# Patient Record
Sex: Male | Born: 1975 | Race: White | Hispanic: No | Marital: Married | Smoking: Never smoker
Health system: Southern US, Community
[De-identification: ages and names within clinical notes are randomized; demographics above are authoritative.]

## PROBLEM LIST (undated history)

## (undated) DIAGNOSIS — I82409 Acute embolism and thrombosis of unspecified deep veins of unspecified lower extremity: Secondary | ICD-10-CM

## (undated) DIAGNOSIS — I10 Essential (primary) hypertension: Secondary | ICD-10-CM

## (undated) HISTORY — PX: ANKLE SURGERY: SHX546

---

## 2002-02-14 ENCOUNTER — Emergency Department (HOSPITAL_COMMUNITY): Admission: EM | Admit: 2002-02-14 | Discharge: 2002-02-15 | Payer: Self-pay | Admitting: Emergency Medicine

## 2007-06-25 ENCOUNTER — Emergency Department (HOSPITAL_COMMUNITY): Admission: EM | Admit: 2007-06-25 | Discharge: 2007-06-26 | Payer: Self-pay | Admitting: Emergency Medicine

## 2011-04-19 LAB — PROTIME-INR

## 2011-04-21 LAB — PROTIME-INR

## 2011-04-25 LAB — PROTIME-INR

## 2011-08-14 LAB — PROTIME-INR

## 2012-04-10 ENCOUNTER — Ambulatory Visit (HOSPITAL_COMMUNITY)
Admission: RE | Admit: 2012-04-10 | Discharge: 2012-04-10 | Disposition: A | Discharge status: Home / Self Care | Payer: 59 | Source: Ambulatory Visit | Attending: Family Medicine | Admitting: Family Medicine

## 2012-04-10 DIAGNOSIS — M79609 Pain in unspecified limb: Secondary | ICD-10-CM

## 2012-04-10 DIAGNOSIS — M7989 Other specified soft tissue disorders: Secondary | ICD-10-CM | POA: Insufficient documentation

## 2012-04-10 DIAGNOSIS — R600 Localized edema: Secondary | ICD-10-CM

## 2012-04-10 NOTE — Progress Notes (Signed)
VASCULAR LAB PRELIMINARY  PRELIMINARY  PRELIMINARY  PRELIMINARY  Right lower extremity venous duplex completed.    Preliminary report:  POSITIVE FOR DVT in the right posterior tibial veins, peroneal vein, popliteal vein, and distal to mid femoral vein. Near occlusion at all levels. Also multiple calf vein DVTs. No evidence of superficial thrombosis or Baker's cyst.  Isao Seltzer, RVS 04/10/2012, 6:05 PM

## 2012-04-10 NOTE — Research (Signed)
[employer] dIET study investigating the utility of D-Dimer to rule out the presence of DVT discussed with patient.  All questions and concerns were addressed and answered prior to obtaining informed consent.  No study procedures were initiated prior to informed consent.  A copy of the signed consent was provided to the patient.  Please contact study coordinator or Dr. Chancy Milroy for any questions or concerns.

## 2012-05-01 ENCOUNTER — Telehealth: Payer: Self-pay | Admitting: Hematology and Oncology

## 2012-05-01 NOTE — Telephone Encounter (Signed)
S/w pt re appt for 8/27 @ 1 pm. Due to pt was driving I also called and lmonvm for pt re appt d/t/location w/my name and direct number. Pt aware he should call me back ASAP should he need to change this appt.

## 2012-05-03 ENCOUNTER — Telehealth: Payer: Self-pay | Admitting: Hematology and Oncology

## 2012-05-03 NOTE — Telephone Encounter (Signed)
Delivered chart on 8/16 for a 8/27 appt. To Dr. Dalene Carrow

## 2012-05-14 ENCOUNTER — Ambulatory Visit (HOSPITAL_BASED_OUTPATIENT_CLINIC_OR_DEPARTMENT_OTHER): Payer: 59 | Admitting: Lab

## 2012-05-14 ENCOUNTER — Encounter: Payer: Self-pay | Admitting: Hematology and Oncology

## 2012-05-14 ENCOUNTER — Ambulatory Visit: Payer: 59

## 2012-05-14 ENCOUNTER — Telehealth: Payer: Self-pay | Admitting: Hematology and Oncology

## 2012-05-14 ENCOUNTER — Telehealth: Payer: Self-pay

## 2012-05-14 ENCOUNTER — Ambulatory Visit (HOSPITAL_BASED_OUTPATIENT_CLINIC_OR_DEPARTMENT_OTHER): Payer: 59 | Admitting: Hematology and Oncology

## 2012-05-14 VITALS — BP 135/86 | HR 68 | Temp 97.8°F | Resp 18 | Ht 66.75 in | Wt 205.6 lb

## 2012-05-14 DIAGNOSIS — I82409 Acute embolism and thrombosis of unspecified deep veins of unspecified lower extremity: Secondary | ICD-10-CM | POA: Insufficient documentation

## 2012-05-14 DIAGNOSIS — D539 Nutritional anemia, unspecified: Secondary | ICD-10-CM | POA: Insufficient documentation

## 2012-05-14 DIAGNOSIS — Z7901 Long term (current) use of anticoagulants: Secondary | ICD-10-CM

## 2012-05-14 DIAGNOSIS — I824Y9 Acute embolism and thrombosis of unspecified deep veins of unspecified proximal lower extremity: Secondary | ICD-10-CM

## 2012-05-14 DIAGNOSIS — I824Z9 Acute embolism and thrombosis of unspecified deep veins of unspecified distal lower extremity: Secondary | ICD-10-CM

## 2012-05-14 LAB — COMPREHENSIVE METABOLIC PANEL (CC13)
ALT: 44 U/L (ref 0–55)
AST: 24 U/L (ref 5–34)
CO2: 26 mEq/L (ref 22–29)
Sodium: 141 mEq/L (ref 136–145)
Total Bilirubin: 0.4 mg/dL (ref 0.20–1.20)
Total Protein: 7.3 g/dL (ref 6.4–8.3)

## 2012-05-14 LAB — CBC WITH DIFFERENTIAL/PLATELET
Basophils Absolute: 0 10*3/uL (ref 0.0–0.1)
Eosinophils Absolute: 0.4 10*3/uL (ref 0.0–0.5)
HGB: 15.9 g/dL (ref 13.0–17.1)
LYMPH%: 30.6 % (ref 14.0–49.0)
MCH: 31.1 pg (ref 27.2–33.4)
MONO#: 0.6 10*3/uL (ref 0.1–0.9)
MONO%: 7.3 % (ref 0.0–14.0)
Platelets: 207 10*3/uL (ref 140–400)
RBC: 5.12 10*6/uL (ref 4.20–5.82)
RDW: 13.3 % (ref 11.0–14.6)
lymph#: 2.4 10*3/uL (ref 0.9–3.3)

## 2012-05-14 NOTE — Patient Instructions (Addendum)
Kenton Kingfisher  [id number]   [facility] - AFTER VISIT SUMMARY   **RECOMMENDATIONS MADE BY THE CONSULTANT AND ANY TEST    RESULTS WILL BE SENT TO YOUR REFERRING DOCTORS.   YOUR EXAM FINDINGS, LABS AND RESULTS WERE DISCUSSED BY YOUR MD TODAY.    Your vital signs are: Filed Vitals:   05/14/12 1331  BP: 135/86  Pulse: 68  Temp: 97.8 F (36.6 C)  Resp: 18    Your Updated drug allergies are: Allergies as of 05/14/2012  . (Not on File)    Your current list of medications are: No current outpatient prescriptions on file.     INSTRUCTIONS GIVEN AND DISCUSSED:  See attached schedule   SPECIAL INSTRUCTIONS/FOLLOW-UP:  See above.  I acknowledge that I have been informed and understand all the instructions given to me and received a copy. I do not have any more questions at this time, but understand that I may call the [facility] at [phone] during business hours should I have any further questions or need assistance in obtaining follow-up care.

## 2012-05-14 NOTE — Telephone Encounter (Signed)
lvm that I spoke w/nurse at Dr Darrell Jewel office. Explained to her pt is having difficulty with pharmacy about filling xarelto Rx. She stated she would be happy to help pt when he calls.

## 2012-05-14 NOTE — Addendum Note (Signed)
Addended by: Gaylord Shih on: 05/14/2012 04:35 PM   Modules accepted: Orders

## 2012-05-14 NOTE — Telephone Encounter (Signed)
Sent pt back to lb and gv him appt for ct 8/30 and f/u 11/19/12.

## 2012-05-14 NOTE — Progress Notes (Signed)
CC:   Pam Drown, M.D.  IDENTIFYING STATEMENT:  The patient is a 36 year old man seen at the request of Dr. Corliss Blacker recurrent lower extremity deep vein thrombosis.  HISTORY OF PRESENT ILLNESS:  The patient's history dates back to a year ago. Following a beach trip he developed pain with swelling in his right calf.  He had seen an orthopedic surgeon who ordered a lower extremity doppler that noted deep vein thrombosis in the popliteal veins.  He was seen by Dr. Corliss Blacker and began Lovenox, which was subsequently bridged to Coumadin.  He was treated for total of 6 months discontinuing anticoagulation March/April 2013.  The patient reports that his INR was somewhat erratic ranging  between 1.9 and 2.1.  On 01/15/2012 Dr. Corliss Blacker obtained a hypercoagulable workup.  Results note that factor V Leiden and prothrombin gene mutation were negative; antithrombin activity was normal at 96; protein C activity 155; protein S activity was slightly low at 54; functional protein C normal at 186; functional protein S normal at 99; anticardiolipin IgG, IgA, and IgM were less than 9 respectively; lupus anticoagulants was not detected; homocystine level was 7.9.  Following a very recent beach trip, the patient had similar complaints of right lower extremity pain with swelling.  On 04/10/2012 he received a right lower extremity venous duplex that was positive for DVT in the right posterior tibial vein, peroneal vein, popliteal vein, and distal to mid thigh with near-occlusion at all levels. There were also multiple calf vein DVTs.  The patient was placed on Xarelto.  He states that this is his preference as he is reluctant to self-inject with Lovenox.  He is also reluctant to resume frequent monitoring of his PT/INR.  He notes less fatigue than when he was on Coumadin.  He denies rectal bleeding and hematemesis.  Denies abdominal pain.  He has not lost any weight.  He denies nausea and vomiting.  His  maternal grandmother was diagnosed with recurrent pulmonary embolism.   MEDICATIONS:  Xarelto 20 mg daily prescribed at Dr. Darrell Jewel office. Multivitamins.  Aleve as needed.  PAST MEDICAL HISTORY: 1. Recurrent DVT.  2.  Status post repair of right ankle fracture.  ALLERGIES:  None.  SOCIAL HISTORY:  The patient is married with 2 children.  Denies alcohol and tobacco use.  He has a Psychologist, clinical business.  FAMILY HISTORY:  As noted above.  Maternal grandmother had recurrent PEs.  HEALTH MAINTENANCE:  Does not have a tendency to receive any physicals.  REVIEW OF SYSTEMS:  Denies fever, chills, night sweats, anorexia, weight loss.  Cardiovascular:  Denies chest pain, PND, orthopnea, ankle swelling.  Respirations:  Denies cough, hemoptysis, wheeze, shortness of breath.  GI:  Denies nausea, vomiting, abdominal pain, diarrhea, melena, hematochezia.  GU:  Denies dysuria, hematuria, nocturia, frequency. Skin:  No bruising or bleeding.  Neurologic:  Denies headaches, vision change, extremity weakness.  Rest of review of systems negative.  PHYSICAL EXAMINATION:  General:  The patient is a well-appearing, well- nourished man in no distress.  Vitals:  Pulse 68, blood pressure 135/86, temperature 97.8, respirations 18, weight 205 pounds.  HEENT:  Head is atraumatic, normocephalic.  Sclerae anicteric.  Mouth moist.  Neck: Supple.  Chest:  Clear to percussion and auscultation.  CVS:  First and second heart sounds present.  No added sounds or murmurs.  Abdomen: Soft, nontender, no masses.  Bowel sounds present.  Rectal:  Deferred. Extremities:  No edema.  Pulses present and symmetrical.  CNS: Nonfocal.  IMPRESSION  AND PLAN:  Mr. 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  He was initially diagnosed a year ago and received 6 months of anticoagulation with Coumadin.  The patient notes having  difficulty to achieve a therapeutic INR during that time. Hypercoagulable workup notes a mild protein S deficiency.  He was found to have recurrence of DVT in July 2013 and he has been on Xarelto since then.  His symptoms have resolved.  He has less fatigued.  He is on the right therapy and I would recommend that he remain on Xarelto for an extended duration.  He will continue to have his prescriptions filled at Dr. Darrell Jewel office.  I feel it is important that we also rule out an occult malignancy especially with his recent recurrence.  I will have him obtain a CT scan of his chest and abdomen and pelvis which he is very agreeable to.  We will telephone him with those results.  If all is well, he will follow up in 6 months' time for continued assessment.  He is here with his wife and they had all their questions and concerns answered.  I reiterated some significant side effects of anticoagulation, specifically bruising or bleeding.  He was also reminded that Xarelto does not typically have an antidote.  However, If severe hemorrhage were to occur, recombinant factor VIIa is an option. If surgery or any invasive procedure was required, Xarelto needed to be held for at least 24 hours prior to the procedure.  Heparin, which has a short half- life, could be used as a bridge.      ______________________________ Laurice Record, M.D. LIO/MEDQ  D:  05/14/2012  T:  05/14/2012  Job:  [id number]

## 2012-05-14 NOTE — Progress Notes (Signed)
This office note has been dictated.

## 2012-05-15 ENCOUNTER — Telehealth: Payer: Self-pay | Admitting: *Deleted

## 2012-05-15 NOTE — Telephone Encounter (Signed)
Called pt at home and left message on voice mail re:  Asked pt to call nurse back for lab results.

## 2012-05-16 ENCOUNTER — Telehealth: Payer: Self-pay | Admitting: *Deleted

## 2012-05-16 NOTE — Telephone Encounter (Signed)
Called pt on cell phone and left message on voice mail re:  Lab results  Good as per md.

## 2012-05-17 ENCOUNTER — Telehealth: Payer: Self-pay | Admitting: *Deleted

## 2012-05-17 ENCOUNTER — Ambulatory Visit (HOSPITAL_COMMUNITY)
Admission: RE | Admit: 2012-05-17 | Discharge: 2012-05-17 | Disposition: A | Discharge status: Home / Self Care | Payer: 59 | Source: Ambulatory Visit | Attending: Hematology and Oncology | Admitting: Hematology and Oncology

## 2012-05-17 DIAGNOSIS — C189 Malignant neoplasm of colon, unspecified: Secondary | ICD-10-CM | POA: Insufficient documentation

## 2012-05-17 DIAGNOSIS — D539 Nutritional anemia, unspecified: Secondary | ICD-10-CM

## 2012-05-17 DIAGNOSIS — Z86718 Personal history of other venous thrombosis and embolism: Secondary | ICD-10-CM | POA: Insufficient documentation

## 2012-05-17 DIAGNOSIS — R911 Solitary pulmonary nodule: Secondary | ICD-10-CM | POA: Insufficient documentation

## 2012-05-17 DIAGNOSIS — I82409 Acute embolism and thrombosis of unspecified deep veins of unspecified lower extremity: Secondary | ICD-10-CM

## 2012-05-17 MED ORDER — IOHEXOL 300 MG/ML  SOLN
100.0000 mL | Freq: Once | INTRAMUSCULAR | Status: AC | PRN
  Administered 2012-05-17: 100 mL via INTRAVENOUS

## 2012-05-17 NOTE — Telephone Encounter (Signed)
Spoke with pt and informed pt re:  CT scans showed no evidence of occult malignancy.   Instructed pt to continue to f/u with primary for Xarelto.   Confirmed appt with Dr. Dalene Carrow in March 2014.   Above instructions from Dr. Dalene Carrow.   Pt voiced understanding.

## 2012-05-17 NOTE — Telephone Encounter (Signed)
Received call from wife Trula Ore re:  Pt had CT scans done this am.  Pt and wife would like to know results.    Pt's  Cell     [phone]  ;    Jackquline Denmark      [phone].

## 2012-06-03 ENCOUNTER — Telehealth: Payer: Self-pay | Admitting: *Deleted

## 2012-06-03 ENCOUNTER — Ambulatory Visit (HOSPITAL_COMMUNITY)
Admission: RE | Admit: 2012-06-03 | Discharge: 2012-06-03 | Disposition: A | Discharge status: Home / Self Care | Payer: 59 | Source: Ambulatory Visit | Attending: Family Medicine | Admitting: Family Medicine

## 2012-06-03 ENCOUNTER — Other Ambulatory Visit: Payer: Self-pay | Admitting: Family

## 2012-06-03 DIAGNOSIS — I82409 Acute embolism and thrombosis of unspecified deep veins of unspecified lower extremity: Secondary | ICD-10-CM

## 2012-06-03 DIAGNOSIS — I82509 Chronic embolism and thrombosis of unspecified deep veins of unspecified lower extremity: Secondary | ICD-10-CM | POA: Insufficient documentation

## 2012-06-03 DIAGNOSIS — M7989 Other specified soft tissue disorders: Secondary | ICD-10-CM

## 2012-06-03 NOTE — Progress Notes (Signed)
Right:  DVT noted in the distal femoral and popliteal veins.  No evidence of superficial thrombosis.  No Baker's cyst.  Left:  Negative for DVT, SVT, or a Baker's cyst.

## 2012-06-03 NOTE — Telephone Encounter (Signed)
Received call from [facility] in Vascular Lab re:  Doppler study showed  Residual  Clot  In  Right  Leg from popliteal to distal femoral.  No new clots  per Dois Davenport.   Annice Pih, NP notified of above results.  Informed Dois Davenport that pt can go home;  Pt needs to f/u with his primary Dr. Corliss Blacker in am for further instructions -  As per NP's instructions.  Dois Davenport voiced understanding and stated she would relay message to pt.

## 2012-06-03 NOTE — Telephone Encounter (Signed)
Pt call to inform nurse re:  Pt experienced more swelling in Right leg today,  Denied pain.   Pt is still taking Xarelto as instructed by his primary.   Pt stated he called primary office and was offered to see another provider due to Dr. Corliss Blacker is out of office.   Pt declined.   Pt was instructed by their office to call Dr. Dalene Carrow.     Annice Pih, NP notified.    Instructed pt to go to [facility] for doppler study today at 4 pm as per NP's instructions.   Pt voiced understanding.

## 2012-06-06 ENCOUNTER — Telehealth: Payer: Self-pay | Admitting: *Deleted

## 2012-06-06 NOTE — Telephone Encounter (Signed)
Called pt to f/u on whether pt had contacted his primary for the increased right leg swelling.   Pt stated " NO " ;  Pt stated he was very busy at work; has no days off.  Pt stated the right leg is still swollen but not as bad.  Pt is still taking Xarelto as instructed by Dr. Corliss Blacker.   Instructed pt that he needed to f/u with his primary for further evaluation.  Pt informed nurse that he would contact primary office soon. Called Dr. Darrell Jewel office and spoke with Wayne.  Gave Chianera info of pt calling Dr. Lonell Face office on 06/03/12 about right leg swelling.  Per Harless Litten,  Message will be relayed to both Dr. Corliss Blacker and her nurse to f/u on 06/07/12 - since they had left for the day.

## 2012-10-19 ENCOUNTER — Telehealth: Payer: Self-pay | Admitting: Hematology and Oncology

## 2012-11-19 ENCOUNTER — Ambulatory Visit: Payer: 59 | Admitting: Hematology and Oncology

## 2013-06-12 ENCOUNTER — Ambulatory Visit (HOSPITAL_COMMUNITY)
Admission: RE | Admit: 2013-06-12 | Discharge: 2013-06-12 | Disposition: A | Discharge status: Home / Self Care | Payer: 59 | Source: Ambulatory Visit | Attending: Family Medicine | Admitting: Family Medicine

## 2013-06-12 ENCOUNTER — Other Ambulatory Visit (HOSPITAL_COMMUNITY): Payer: Self-pay | Admitting: Family Medicine

## 2013-06-12 DIAGNOSIS — Z09 Encounter for follow-up examination after completed treatment for conditions other than malignant neoplasm: Secondary | ICD-10-CM

## 2013-06-12 DIAGNOSIS — Z86718 Personal history of other venous thrombosis and embolism: Secondary | ICD-10-CM

## 2013-06-12 DIAGNOSIS — Z79899 Other long term (current) drug therapy: Secondary | ICD-10-CM | POA: Insufficient documentation

## 2013-06-12 NOTE — Progress Notes (Signed)
*  Preliminary Results* Right lower extremity venous duplex completed. Right lower extremity is positive for subacute vs. age indeterminate deep vein thrombosis involving the right popliteal vein. There is no evidence of right Baker's cyst.  Preliminary results discussed with Annabelle Harman, RN.  06/12/2013 11:26 AM  Gertie Fey, RVT, RDCS, RDMS

## 2013-09-22 ENCOUNTER — Emergency Department (HOSPITAL_BASED_OUTPATIENT_CLINIC_OR_DEPARTMENT_OTHER): Payer: 59

## 2013-09-22 ENCOUNTER — Encounter (HOSPITAL_BASED_OUTPATIENT_CLINIC_OR_DEPARTMENT_OTHER): Payer: Self-pay | Admitting: Emergency Medicine

## 2013-09-22 ENCOUNTER — Inpatient Hospital Stay (HOSPITAL_BASED_OUTPATIENT_CLINIC_OR_DEPARTMENT_OTHER)
Admission: EM | Admit: 2013-09-22 | Discharge: 2013-09-22 | DRG: 176 | Disposition: A | Discharge status: Home / Self Care | Payer: 59 | Attending: Internal Medicine | Admitting: Internal Medicine

## 2013-09-22 DIAGNOSIS — D539 Nutritional anemia, unspecified: Secondary | ICD-10-CM

## 2013-09-22 DIAGNOSIS — D6859 Other primary thrombophilia: Secondary | ICD-10-CM | POA: Diagnosis present

## 2013-09-22 DIAGNOSIS — I82401 Acute embolism and thrombosis of unspecified deep veins of right lower extremity: Secondary | ICD-10-CM

## 2013-09-22 DIAGNOSIS — Z79899 Other long term (current) drug therapy: Secondary | ICD-10-CM

## 2013-09-22 DIAGNOSIS — I2699 Other pulmonary embolism without acute cor pulmonale: Principal | ICD-10-CM

## 2013-09-22 DIAGNOSIS — Z86718 Personal history of other venous thrombosis and embolism: Secondary | ICD-10-CM

## 2013-09-22 DIAGNOSIS — Z86711 Personal history of pulmonary embolism: Secondary | ICD-10-CM

## 2013-09-22 DIAGNOSIS — I82409 Acute embolism and thrombosis of unspecified deep veins of unspecified lower extremity: Secondary | ICD-10-CM

## 2013-09-22 HISTORY — DX: Acute embolism and thrombosis of unspecified deep veins of unspecified lower extremity: I82.409

## 2013-09-22 LAB — BASIC METABOLIC PANEL
BUN: 17 mg/dL (ref 6–23)
CALCIUM: 9.1 mg/dL (ref 8.4–10.5)
CO2: 25 mEq/L (ref 19–32)
Chloride: 105 mEq/L (ref 96–112)
Creatinine, Ser: 0.9 mg/dL (ref 0.50–1.35)
GLUCOSE: 106 mg/dL — AB (ref 70–99)
Potassium: 4 mEq/L (ref 3.7–5.3)
SODIUM: 143 meq/L (ref 137–147)

## 2013-09-22 LAB — CBC WITH DIFFERENTIAL/PLATELET
BASOS PCT: 0 % (ref 0–1)
Basophils Absolute: 0 10*3/uL (ref 0.0–0.1)
EOS PCT: 4 % (ref 0–5)
Eosinophils Absolute: 0.3 10*3/uL (ref 0.0–0.7)
HCT: 44.3 % (ref 39.0–52.0)
Hemoglobin: 15.6 g/dL (ref 13.0–17.0)
Lymphocytes Relative: 20 % (ref 12–46)
Lymphs Abs: 1.9 10*3/uL (ref 0.7–4.0)
MCH: 31 pg (ref 26.0–34.0)
MCHC: 35.2 g/dL (ref 30.0–36.0)
MCV: 88.1 fL (ref 78.0–100.0)
MONO ABS: 1.1 10*3/uL — AB (ref 0.1–1.0)
MONOS PCT: 11 % (ref 3–12)
Neutro Abs: 6.2 10*3/uL (ref 1.7–7.7)
Neutrophils Relative %: 65 % (ref 43–77)
Platelets: 180 10*3/uL (ref 150–400)
RBC: 5.03 MIL/uL (ref 4.22–5.81)
RDW: 12.9 % (ref 11.5–15.5)
WBC: 9.6 10*3/uL (ref 4.0–10.5)

## 2013-09-22 LAB — TROPONIN I: Troponin I: <0.3 ng/mL (ref <0.30)

## 2013-09-22 LAB — HEPARIN LEVEL (UNFRACTIONATED): Heparin Unfractionated: 0.29 IU/mL — ABNORMAL LOW (ref 0.30–0.70)

## 2013-09-22 MED ORDER — ONDANSETRON HCL 4 MG/2ML IJ SOLN: 4.0000 mg | Freq: Four times a day (QID) | INTRAMUSCULAR | Status: DC | PRN

## 2013-09-22 MED ORDER — DOCUSATE SODIUM 100 MG PO CAPS
100.0000 mg | ORAL_CAPSULE | Freq: Two times a day (BID) | ORAL | Status: DC
  Filled 2013-09-22 (×2): qty 1

## 2013-09-22 MED ORDER — HEPARIN (PORCINE) IN NACL 100-0.45 UNIT/ML-% IJ SOLN
15.0000 [IU]/kg/h | Freq: Once | INTRAMUSCULAR | Status: AC
  Administered 2013-09-22: 15 [IU]/kg/h via INTRAVENOUS
  Filled 2013-09-22: qty 250

## 2013-09-22 MED ORDER — RIVAROXABAN 15 MG PO TABS
15.0000 mg | ORAL_TABLET | Freq: Two times a day (BID) | ORAL | Status: DC
  Administered 2013-09-22: 15 mg via ORAL
  Filled 2013-09-22 (×4): qty 1

## 2013-09-22 MED ORDER — ONDANSETRON HCL 4 MG PO TABS: 4.0000 mg | ORAL_TABLET | Freq: Four times a day (QID) | ORAL | Status: DC | PRN

## 2013-09-22 MED ORDER — HYDROCODONE-ACETAMINOPHEN 5-325 MG PO TABS
1.0000 | ORAL_TABLET | ORAL | Status: DC | PRN
  Administered 2013-09-22: 2 via ORAL
  Filled 2013-09-22: qty 2

## 2013-09-22 MED ORDER — ACETAMINOPHEN 650 MG RE SUPP: 650.0000 mg | Freq: Four times a day (QID) | RECTAL | Status: DC | PRN

## 2013-09-22 MED ORDER — RIVAROXABAN (XARELTO) EDUCATION KIT FOR DVT/PE PATIENTS
PACK | Freq: Once | Status: AC
  Administered 2013-09-22: 12:00:00
  Filled 2013-09-22: qty 1

## 2013-09-22 MED ORDER — IOHEXOL 350 MG/ML SOLN
80.0000 mL | Freq: Once | INTRAVENOUS | Status: AC | PRN
  Administered 2013-09-22: 80 mL via INTRAVENOUS

## 2013-09-22 MED ORDER — ACETAMINOPHEN 325 MG PO TABS: 650.0000 mg | ORAL_TABLET | Freq: Four times a day (QID) | ORAL | Status: DC | PRN

## 2013-09-22 MED ORDER — RIVAROXABAN 20 MG PO TABS: 20.0000 mg | ORAL_TABLET | Freq: Every day | ORAL | Status: DC

## 2013-09-22 MED ORDER — MORPHINE SULFATE 2 MG/ML IJ SOLN: 2.0000 mg | INTRAMUSCULAR | Status: DC | PRN

## 2013-09-22 MED ORDER — SODIUM CHLORIDE 0.9 % IV SOLN: INTRAVENOUS | Status: DC

## 2013-09-22 MED ORDER — HEPARIN (PORCINE) IN NACL 100-0.45 UNIT/ML-% IJ SOLN
1350.0000 [IU]/h | INTRAMUSCULAR | Status: DC
  Administered 2013-09-22: 1350 [IU]/h via INTRAVENOUS
  Filled 2013-09-22: qty 250

## 2013-09-22 MED ORDER — MORPHINE SULFATE 4 MG/ML IJ SOLN
4.0000 mg | Freq: Once | INTRAMUSCULAR | Status: AC
  Administered 2013-09-22: 4 mg via INTRAVENOUS
  Filled 2013-09-22: qty 1

## 2013-09-22 MED ORDER — RIVAROXABAN (XARELTO) VTE STARTER PACK (15 & 20 MG): ORAL_TABLET | ORAL | Status: AC

## 2013-09-22 MED ORDER — SODIUM CHLORIDE 0.9 % IJ SOLN: 3.0000 mL | Freq: Two times a day (BID) | INTRAMUSCULAR | Status: DC

## 2013-09-22 NOTE — H&P (Addendum)
PCP: MCNEILL,WENDY, MD  Hematologist Odagwu  Chief Complaint:  Shortness of breath  HPI: Glenn Jackson is a 38 y.o. male   has a past medical history of DVT (deep venous thrombosis).   Presented with  Patient have had DVT  Twice in the past. He was diagnoses with "some protein deficiency". Patient was Diagnosed with last DVT in august 2013 and just finished 1 year course of Xarelto this September 3 months ago. Patient was at his baseline of health and took a trip to [place] by car. He denies any leg swelling but started to have pleuritic chest pain. He presented to ER and was diagnosed with Segment bilateral  PE.  Patient was started on Heparin and transferred to [facility]. A venous doppler was done that showed right mid and distal superficial femoral vein.  Patient at this point states he is not interested in having an IVF placed.   Review of Systems:   Pertinent positives include:  chest pain,  Constitutional:  No weight loss, night sweats, Fevers, chills, fatigue, weight loss  HEENT:  No headaches, Difficulty swallowing,Tooth/dental problems,Sore throat,  No sneezing, itching, ear ache, nasal congestion, post nasal drip,  Cardio-vascular:  No Orthopnea, PND, anasarca, dizziness, palpitations.no Bilateral lower extremity swelling  GI:  No heartburn, indigestion, abdominal pain, nausea, vomiting, diarrhea, change in bowel habits, loss of appetite, melena, blood in stool, hematemesis Resp:  no shortness of breath at rest. No dyspnea on exertion, No excess mucus, no productive cough, No non-productive cough, No coughing up of blood.No change in color of mucus.No wheezing. Skin:  no rash or lesions. No jaundice GU:  no dysuria, change in color of urine, no urgency or frequency. No straining to urinate.  No flank pain.  Musculoskeletal:  No joint pain or no joint swelling. No decreased range of motion. No back pain.  Psych:  No change in mood or affect. No depression or anxiety. No  memory loss.  Neuro: no localizing neurological complaints, no tingling, no weakness, no double vision, no gait abnormality, no slurred speech, no confusion  Otherwise ROS are negative except for above, 10 systems were reviewed  Past Medical History: Past Medical History  Diagnosis Date  . DVT (deep venous thrombosis)    Past Surgical History  Procedure Laterality Date  . Ankle surgery       Medications: Prior to Admission medications   Medication Sig Start Date End Date Taking? Authorizing Provider  Multiple Vitamin (MULTIVITAMIN) tablet Take 1 tablet by mouth daily.    Historical Provider, MD  Rivaroxaban (XARELTO) 15 MG TABS tablet Take 15 mg by mouth daily.    Historical Provider, MD    Allergies:   Allergies  Allergen Reactions  . Ibuprofen Other (See Comments)    Mouth sores  . Pseudoephedrine Other (See Comments)    hypertension    Social History:  Ambulatory   independently   Lives at   Home with wife   reports that he has never smoked. He does not have any smokeless tobacco history on file. He reports that he drinks alcohol. He reports that he does not use illicit drugs.   Family History: family history is not on file.    Physical Exam: Patient Vitals for the past 24 hrs:  BP Temp Temp src Pulse Resp SpO2 Height Weight  09/22/13 0535 138/82 mmHg 97.6 F (36.4 C) Oral 61 18 98 % 5\' 7"  (1.702 m) 91.989 kg (202 lb 12.8 oz)  09/22/13 0416 134/67 mmHg - -  72 18 97 % - -  09/22/13 0329 162/80 mmHg - - 73 18 98 % - -  09/22/13 0154 145/96 mmHg 98.5 F (36.9 C) Oral 71 16 98 % 5\' 7"  (1.702 m) 90.719 kg (200 lb)    1. General:  in No Acute distress 2. Psychological: Alert and   Oriented 3. Head/ENT:   Moist  Mucous Membranes                          Head Non traumatic, neck supple                          Normal   Dentition 4. SKIN: normal   Skin turgor,  Skin clean Dry and intact no rash 5. Heart: Regular rate and rhythm no Murmur, Rub or gallop 6.  Lungs: Clear to auscultation bilaterally, no wheezes or crackles   7. Abdomen: Soft, non-tender, Non distended 8. Lower extremities: no clubbing, cyanosis, or edema 9. Neurologically Grossly intact, moving all 4 extremities equally 10. MSK: Normal range of motion  body mass index is 31.76 kg/(m^2).   Labs on Admission:   Recent Labs  09/22/13 0155  NA 143  K 4.0  CL 105  CO2 25  GLUCOSE 106*  BUN 17  CREATININE 0.90  CALCIUM 9.1   No results found for this basename: AST, ALT, ALKPHOS, BILITOT, PROT, ALBUMIN,  in the last 72 hours No results found for this basename: LIPASE, AMYLASE,  in the last 72 hours  Recent Labs  09/22/13 0155  WBC 9.6  NEUTROABS 6.2  HGB 15.6  HCT 44.3  MCV 88.1  PLT 180    Recent Labs  09/22/13 0155  TROPONINI <0.30   No results found for this basename: TSH, T4TOTAL, FREET3, T3FREE, THYROIDAB,  in the last 72 hours No results found for this basename: VITAMINB12, FOLATE, FERRITIN, TIBC, IRON, RETICCTPCT,  in the last 72 hours No results found for this basename: HGBA1C    Estimated Creatinine Clearance: 120.4 ml/min (by C-G formula based on Cr of 0.9). ABG No results found for this basename: phart, pco2, po2, hco3, tco2, acidbasedef, o2sat     No results found for this basename: DDIMER     Other results:  I have pearsonaly reviewed this: ECG REPORT  Rate: 73  Rhythm: NSR ST&T Change: no ischemic changes  Cultures: No results found for this basename: sdes, specrequest, cult, reptstatus       Radiological Exams on Admission: Ct Angio Chest Pe W/cm &/or Wo Cm  09/22/2013   CLINICAL DATA:  Chest pain, history of deep vein thrombosis.  EXAM: CT ANGIOGRAPHY CHEST WITH CONTRAST  TECHNIQUE: Multidetector CT imaging of the chest was performed using the standard protocol during bolus administration of intravenous contrast. Multiplanar CT image reconstructions including MIPs were obtained to evaluate the vascular anatomy.  CONTRAST:  66mL  OMNIPAQUE IOHEXOL 350 MG/ML SOLN  COMPARISON:  CT of the chest, abdomen and pelvis May 17, 2012.  FINDINGS: Suboptimal, delayed bolus timing limits evaluation. However, nonocclusive right lower lobe segmental the subsegmental pulmonary artery filling defect, axial 184/269. Occlusive left lower lobe sub segmental pulmonary embolus, axial 194/269. The main pulmonary artery is not enlarged.  Less than 5 mm right middle lobe ground-glass nodules are unchanged from prior examination. No new or solid pulmonary nodules, no masses, no focal consolidations or pleural effusions. Tracheobronchial tree is patent and midline.  The heart and  pericardium are unremarkable, no right heart strain. Thoracic aorta is normal in course and caliber, unremarkable. No lymphadenopathy by CT size criteria. Thoracic esophagus is unremarkable.  Again noted is a low-density 13 mm lesion interposed between the stomach and spleen which may reflect a diverticulum, and is unchanged. Mild degenerative change of the right acromioclavicular joint. Scattered thoracic Schmorl's nodes.  Review of the MIP images confirms the above findings.  IMPRESSION: Nonocclusive right lower lobe segmental to subsegmental pulmonary embolus. Occlusive left lower lobe subsegmental pulmonary embolus. No right heart strain.  No acute pulmonary process.  Critical Value/emergent results were called by telephone at the time of interpretation on 09/22/2013 at 3:11 AM to Dr. Maggie Schwalbe , who verbally acknowledged these results.   Electronically Signed   By: Elon Alas   On: 09/22/2013 03:13   US Venous Img Lower Bilateral  09/22/2013   CLINICAL DATA:  Chest pain history of right-sided DVT  EXAM: Bilateral LOWER EXTREMITY VENOUS DOPPLER ULTRASOUND  TECHNIQUE: Gray-scale sonography with graded compression, as well as color Doppler and duplex ultrasound, were performed to evaluate the deep venous system from the level of the Jackson femoral vein through the  popliteal and proximal calf veins (as permitted by patient anatomy. Spectral Doppler was utilized to evaluate flow at rest and with distal augmentation maneuvers.  COMPARISON:  None.  FINDINGS: Left side:  Thrombus within deep veins:  None visualized.  Compressibility of deep veins:  Normal.  Duplex waveform respiratory phasicity:  Normal.  Duplex waveform response to augmentation:  Normal.  Right side:  Mid level echoes fill the noncompressible superficial femoral vein in the mid and distal portions. There is no venous expansion typical of acute clot, but there is also no re- cannulization or peripheralization to suggest for chronicity. The remaining left-sided deep venous system is patent. Preserved respiratory phasicitiy.  IMPRESSION: 1. Positive for thrombus in the right mid and distal superficial femoral vein. 2. No left lower extremity deep venous thrombosis.   Electronically Signed   By: Jorje Guild M.D.   On: 09/22/2013 04:01    Chart has been reviewed  Assessment/Plan  38 yo M W hx of 2 DVT in the past with likely hypercoagulable state presents with another DVT and PE while off is anticoagulant after a long trip.    Present on Admission:   . Pulmonary embolism - on heparin gtt once ready for DC will convert to xarelto. PAtient have done well on this in the past. He has not failed anticoagulation since he was not currently taking anything. Given moderate clot burden will obtain 2d echo. monitor on telemetry. He will need to stay on life long anticoagulation giver repeated DVT's now PE Will not repeat hypercoagulation workup at his point as patient states he has had extensive work up in the past.    Prophylaxis: heparin   CODE STATUS: FULL CODE  Other plan as per orders.  I have spent a total of 55 min on this admission  Glenn Jackson 09/22/2013, 6:25 AM

## 2013-09-22 NOTE — ED Notes (Signed)
Pt requesting something for continued left sided cp. MD aware and orders received.

## 2013-09-22 NOTE — ED Notes (Addendum)
C/o left sided chest pain that started Sunday afternoon. States pain is worse with inspiration. Describes as sharp and states pain worse with movement. Denies any sob. Denies any n/v. States pain is not relieved with aleve or heating pad. States hx of DVT. Has been on a long car ride this past week. (20 hours) [place]. States he did stop every 2 hours. Denies any recent cough/fever/cold/or congestion. Has been off of Xarelto for past 3 months. Also, states he was helping his Dad pull wires today and states he doesn't know if he "pulled something"

## 2013-09-22 NOTE — ED Notes (Signed)
Transported to CT scan

## 2013-09-22 NOTE — ED Provider Notes (Signed)
CSN: [id number]     Arrival date & time 09/22/13  0138 History   None    Chief Complaint  Patient presents with  . Chest Pain   (Consider location/radiation/quality/duration/timing/severity/associated sxs/prior Treatment) Patient is a 38 y.o. male presenting with chest pain. The history is provided by the patient.  Chest Pain Pain location:  L chest Pain quality: sharp   Pain radiates to:  Does not radiate Pain radiates to the back: no   Pain severity:  Moderate Onset quality:  Sudden Duration:  11 hours Timing:  Constant Progression:  Unchanged Chronicity:  New Context: not lifting, not raising an arm and no trauma   Relieved by:  Nothing Worsened by:  Nothing tried Ineffective treatments:  None tried Associated symptoms: no abdominal pain, no diaphoresis, no lower extremity edema, no orthopnea, no palpitations, no shortness of breath and not vomiting   Risk factors: prior DVT/PE   Risk factors: no aortic disease and no smoking     Past Medical History  Diagnosis Date  . DVT (deep venous thrombosis)    Past Surgical History  Procedure Laterality Date  . Ankle surgery     No family history on file. History  Substance Use Topics  . Smoking status: Never Smoker   . Smokeless tobacco: Not on file  . Alcohol Use: No    Review of Systems  Constitutional: Negative for diaphoresis.  Respiratory: Negative for shortness of breath.   Cardiovascular: Positive for chest pain. Negative for palpitations, orthopnea and leg swelling.  Gastrointestinal: Negative for vomiting and abdominal pain.  All other systems reviewed and are negative.    Allergies  Ibuprofen and Pseudoephedrine  Home Medications   Current Outpatient Rx  Name  Route  Sig  Dispense  Refill  . Multiple Vitamin (MULTIVITAMIN) tablet   Oral   Take 1 tablet by mouth daily.         . Rivaroxaban (XARELTO) 15 MG TABS tablet   Oral   Take 15 mg by mouth daily.          BP 145/96  Pulse 71   Temp(Src) 98.5 F (36.9 C) (Oral)  Resp 16  Ht 5\' 7"  (1.702 m)  Wt 200 lb (90.719 kg)  BMI 31.32 kg/m2  SpO2 98% Physical Exam  Constitutional: He is oriented to person, place, and time. He appears well-developed and well-nourished. No distress.  HENT:  Head: Normocephalic and atraumatic.  Mouth/Throat: Oropharynx is clear and moist.  Eyes: Conjunctivae are normal.  Neck: Normal range of motion. Neck supple.  Cardiovascular: Normal rate, regular rhythm and intact distal pulses.   Pulmonary/Chest: Effort normal and breath sounds normal. He has no wheezes. He has no rales. He exhibits tenderness.  Abdominal: Soft. Bowel sounds are normal. There is no tenderness. There is no rebound and no guarding.  Musculoskeletal: Normal range of motion.  Neurological: He is alert and oriented to person, place, and time.  Skin: Skin is warm and dry.  Psychiatric: He has a normal mood and affect.    ED Course  Procedures (including critical care time) Labs Review Labs Reviewed - No data to display Imaging Review No results found.  EKG Interpretation    Date/Time:  Monday September 22 2013 01:48:42 EST Ventricular Rate:  73 PR Interval:  180 QRS Duration: 100 QT Interval:  368 QTC Calculation: 405 R Axis:   32 Text Interpretation:  Normal sinus rhythm Confirmed by [facility]  MD, Marites Nath ([id number]) on 09/22/2013 1:57:23 AM  MDM  No diagnosis found. In the setting of > 8 hours of constant symptoms with a normal EKG and troponin ACS is excluded.  Will admit for PEs   Gaylyn Berish K Denetta Fei-Rasch, MD 09/22/13 [phone]

## 2013-09-22 NOTE — Progress Notes (Addendum)
ANTICOAGULATION CONSULT NOTE - Initial Consult  Pharmacy Consult for IV Heparin --> Xarelto Indication: pulmonary embolus  Allergies  Allergen Reactions  . Ibuprofen Other (See Comments)    Mouth sores  . Pseudoephedrine Other (See Comments)    hypertension    Patient Measurements: Height: 5\' 7"  (170.2 cm) Weight: 202 lb 12.8 oz (91.989 kg) IBW/kg (Calculated) : 66.1 Heparin Dosing Weight: 85 kg  Vital Signs: Temp: 97.6 F (36.4 C) (01/05 0535) Temp src: Oral (01/05 0535) BP: 138/82 mmHg (01/05 0535) Pulse Rate: 61 (01/05 0535)  Labs:  Recent Labs  09/22/13 0155  HGB 15.6  HCT 44.3  PLT 180  CREATININE 0.90  TROPONINI <0.30    Estimated Creatinine Clearance: 120.4 ml/min (by C-G formula based on Cr of 0.9).   Medical History: Past Medical History  Diagnosis Date  . 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  Patient was started on heparin drip at [facility] and transferred to [facility].  Pharmacy consulted to manage heparin.  IV Heparin currently running at 1350 units/hr, which was started at 0400 this AM.  Does not appear that patient received bolus dose of heparin.  Do not see that baseline anticoagulant labs were obtained.  Baseline SCr and CBC WNL.  Goal of Therapy:  Heparin level 0.3-0.7 units/ml Monitor platelets by anticoagulation protocol: Yes   Plan:  1.  Continue heparin at 1350 units/hr (~15.8 units/kg/hr).  Check heparin level at 1000 (~6 hours following start of heparin). 2.  Daily HL and CBC while on heparin.  Hershal Coria 09/22/2013,7:50 AM   Addendum: 09/22/2013 10:55 AM Plan is to transition patient to Xarelto for discharge today.  Stop IV heparin and start Xarelto 15 mg BID x 21 days (Xarelto may be started at same time of discontinuation of heparin infusion) followed by Xarelto 20 mg daily.  Patient is familiar with Xarelto.  Will  re-educate prior to discharge.  Hershal Coria, PharmD, BCPS Pager: [phone] 09/22/2013 10:57 AM

## 2013-09-22 NOTE — ED Notes (Signed)
[facility] here to transport pt to [facility] °

## 2013-09-22 NOTE — ED Notes (Signed)
Attempt to call report to [facility]. RN in with a pt. Will call me back for report.

## 2013-09-22 NOTE — ED Notes (Signed)
Returned to CT. VSS. Denies any sob at present. Sinus on monitor.

## 2013-09-22 NOTE — ED Notes (Signed)
Report called to [facility], RN

## 2013-09-22 NOTE — ED Notes (Signed)
Report given to [facility] 

## 2013-09-22 NOTE — ED Notes (Signed)
Pt remains in CT

## 2013-09-22 NOTE — Discharge Summary (Signed)
Physician Discharge Summary  Glenn Jackson:[id number] DOB: 1975/12/08 DOA: 09/22/2013  PCP: Cari Caraway, MD  Admit date: 09/22/2013 Discharge date: 09/22/2013  Time spent: > 35 minutes  Recommendations for Outpatient Follow-up:  Please be sure to follow up with your primary care physician in 1-2 weeks or sooner should any new concerns arise.  Discharge Diagnoses:  Active Problems:   PE (pulmonary thromboembolism)   Pulmonary embolism   Discharge Condition: stable  Diet recommendation: heart healthy  Filed Weights   09/22/13 0154 09/22/13 0535  Weight: 90.719 kg (200 lb) 91.989 kg (202 lb 12.8 oz)    History of present illness:  38 y/o with hypercoagulable state with prior history of pulmonary embolism who was on xarelto ( but has been off of it for the last 4 months). Presented to the ED complaining of SOB after recent trip to [place].  Hospital Course:  PE/DVT - CT angiogram of chest obtained and results listed above. Made no reports of right heart strain - Patient currently feeling better with no complaints and is inquiring about discharge. Agrees to start taking xarelto. Will prescribe xarelto starter pack on discharge which patient can obtain rx's through our outpatient pharmacy. - Doppler of LE positive for blood clot as well. Please see below. - Plan will be for xarelto, patient declined IVC filter  Procedures:  None  Consultations:  none  Discharge Exam: Filed Vitals:   09/22/13 0535  BP: 138/82  Pulse: 61  Temp: 97.6 F (36.4 C)  Resp: 18    General: Pt in NAD, alert and awake Cardiovascular: RRR, no MRG Respiratory:  Speaking in full sentences on room air, no wheezes, no increased WOB  Discharge Instructions  Discharge Orders   Future Orders Complete By Expires   Diet - low sodium heart healthy  As directed    Discharge instructions  As directed    Comments:     Discharge home on xarelto. Patient to follow up with pcp for further  evaluation and recommendations.   Increase activity slowly  As directed        Medication List         multivitamin with minerals Tabs tablet  Take 1 tablet by mouth every morning.     Rivaroxaban 15 & 20 MG Tbpk  Commonly known as:  XARELTO STARTER PACK  Take as directed on package: Start with one 15mg  tablet by mouth twice a day with food. On Day 22, switch to one 20mg  tablet once a day with food.       Allergies  Allergen Reactions  . Ibuprofen Other (See Comments)    Mouth sores  . Pseudoephedrine Other (See Comments)    hypertension      The results of significant diagnostics from this hospitalization (including imaging, microbiology, ancillary and laboratory) are listed below for reference.    Significant Diagnostic Studies: Ct Angio Chest Pe W/cm &/or Wo Cm  09/22/2013   CLINICAL DATA:  Chest pain, history of deep vein thrombosis.  EXAM: CT ANGIOGRAPHY CHEST WITH CONTRAST  TECHNIQUE: Multidetector CT imaging of the chest was performed using the standard protocol during bolus administration of intravenous contrast. Multiplanar CT image reconstructions including MIPs were obtained to evaluate the vascular anatomy.  CONTRAST:  51mL OMNIPAQUE IOHEXOL 350 MG/ML SOLN  COMPARISON:  CT of the chest, abdomen and pelvis May 17, 2012.  FINDINGS: Suboptimal, delayed bolus timing limits evaluation. However, nonocclusive right lower lobe segmental the subsegmental pulmonary artery filling defect, axial 184/269. Occlusive  left lower lobe sub segmental pulmonary embolus, axial 194/269. The main pulmonary artery is not enlarged.  Less than 5 mm right middle lobe ground-glass nodules are unchanged from prior examination. No new or solid pulmonary nodules, no masses, no focal consolidations or pleural effusions. Tracheobronchial tree is patent and midline.  The heart and pericardium are unremarkable, no right heart strain. Thoracic aorta is normal in course and caliber, unremarkable. No  lymphadenopathy by CT size criteria. Thoracic esophagus is unremarkable.  Again noted is a low-density 13 mm lesion interposed between the stomach and spleen which may reflect a diverticulum, and is unchanged. Mild degenerative change of the right acromioclavicular joint. Scattered thoracic Schmorl's nodes.  Review of the MIP images confirms the above findings.  IMPRESSION: Nonocclusive right lower lobe segmental to subsegmental pulmonary embolus. Occlusive left lower lobe subsegmental pulmonary embolus. No right heart strain.  No acute pulmonary process.  Critical Value/emergent results were called by telephone at the time of interpretation on 09/22/2013 at 3:11 AM to Dr. Maggie Schwalbe , who verbally acknowledged these results.   Electronically Signed   By: Elon Alas   On: 09/22/2013 03:13   US Venous Img Lower Bilateral  09/22/2013   CLINICAL DATA:  Chest pain history of right-sided DVT  EXAM: Bilateral LOWER EXTREMITY VENOUS DOPPLER ULTRASOUND  TECHNIQUE: Gray-scale sonography with graded compression, as well as color Doppler and duplex ultrasound, were performed to evaluate the deep venous system from the level of the common femoral vein through the popliteal and proximal calf veins (as permitted by patient anatomy. Spectral Doppler was utilized to evaluate flow at rest and with distal augmentation maneuvers.  COMPARISON:  None.  FINDINGS: Left side:  Thrombus within deep veins:  None visualized.  Compressibility of deep veins:  Normal.  Duplex waveform respiratory phasicity:  Normal.  Duplex waveform response to augmentation:  Normal.  Right side:  Mid level echoes fill the noncompressible superficial femoral vein in the mid and distal portions. There is no venous expansion typical of acute clot, but there is also no re- cannulization or peripheralization to suggest for chronicity. The remaining left-sided deep venous system is patent. Preserved respiratory phasicitiy.  IMPRESSION: 1. Positive for  thrombus in the right mid and distal superficial femoral vein. 2. No left lower extremity deep venous thrombosis.   Electronically Signed   By: Jorje Guild M.D.   On: 09/22/2013 04:01    Microbiology: No results found for this or any previous visit (from the past 240 hour(s)).   Labs: Basic Metabolic Panel:  Recent Labs Lab 09/22/13 0155  NA 143  K 4.0  CL 105  CO2 25  GLUCOSE 106*  BUN 17  CREATININE 0.90  CALCIUM 9.1   Liver Function Tests: No results found for this basename: AST, ALT, ALKPHOS, BILITOT, PROT, ALBUMIN,  in the last 168 hours No results found for this basename: LIPASE, AMYLASE,  in the last 168 hours No results found for this basename: AMMONIA,  in the last 168 hours CBC:  Recent Labs Lab 09/22/13 0155  WBC 9.6  NEUTROABS 6.2  HGB 15.6  HCT 44.3  MCV 88.1  PLT 180   Cardiac Enzymes:  Recent Labs Lab 09/22/13 0155  TROPONINI <0.30   BNP: BNP (last 3 results) No results found for this basename: PROBNP,  in the last 8760 hours CBG: No results found for this basename: GLUCAP,  in the last 168 hours     Signed:  Velvet Bathe  [facility] 09/22/2013,  1:25 PM

## 2013-09-22 NOTE — Progress Notes (Signed)
UR completed 

## 2013-09-22 NOTE — ED Notes (Signed)
Report given to Greg with [facility]. 

## 2013-09-22 NOTE — Discharge Instructions (Signed)
Information on my medicine - XARELTO (rivaroxaban)  This medication education was reviewed with me or my healthcare representative as part of my discharge preparation.  The pharmacist that spoke with me during my hospital stay was:  Hershal Coria, [place]? Xarelto was prescribed to treat blood clots that may have been found in the veins of your legs (deep vein thrombosis) or in your lungs (pulmonary embolism) and to reduce the risk of them occurring again.  What do you need to know about Xarelto? The starting dose is one 15 mg tablet taken TWICE daily with food for the FIRST 21 DAYS then on (enter date)  10/13/13  the dose is changed to one 20 mg tablet taken ONCE A DAY with your evening meal.  DO NOT stop taking Xarelto without talking to the health care provider who prescribed the medication.  Refill your prescription for 20 mg tablets before you run out.  After discharge, you should have regular check-up appointments with your healthcare provider that is prescribing your Xarelto.  In the future your dose may need to be changed if your kidney function changes by a significant amount.  What do you do if you miss a dose? If you are taking Xarelto TWICE DAILY and you miss a dose, take it as soon as you remember. You may take two 15 mg tablets (total 30 mg) at the same time then resume your regularly scheduled 15 mg twice daily the next day.  If you are taking Xarelto ONCE DAILY and you miss a dose, take it as soon as you remember on the same day then continue your regularly scheduled once daily regimen the next day. Do not take two doses of Xarelto at the same time.   Important Safety Information Xarelto is a blood thinner medicine that can cause bleeding. You should call your healthcare provider right away if you experience any of the following:   Bleeding from an injury or your nose that does not stop.   Unusual colored urine (red or dark brown) or  unusual colored stools (red or black).   Unusual bruising for unknown reasons.   A serious fall or if you hit your head (even if there is no bleeding).  Some medicines may interact with Xarelto and might increase your risk of bleeding while on Xarelto. To help avoid this, consult your healthcare provider or pharmacist prior to using any new prescription or non-prescription medications, including herbals, vitamins, non-steroidal anti-inflammatory drugs (NSAIDs) and supplements.  This website has more information on Xarelto: [email or web address]

## 2016-09-22 DIAGNOSIS — M7671 Peroneal tendinitis, right leg: Secondary | ICD-10-CM | POA: Diagnosis not present

## 2016-09-22 DIAGNOSIS — M722 Plantar fascial fibromatosis: Secondary | ICD-10-CM | POA: Diagnosis not present

## 2016-09-26 DIAGNOSIS — M722 Plantar fascial fibromatosis: Secondary | ICD-10-CM | POA: Diagnosis not present

## 2016-10-09 DIAGNOSIS — Z79899 Other long term (current) drug therapy: Secondary | ICD-10-CM | POA: Diagnosis not present

## 2016-10-09 DIAGNOSIS — Z86711 Personal history of pulmonary embolism: Secondary | ICD-10-CM | POA: Diagnosis not present

## 2016-10-10 DIAGNOSIS — M722 Plantar fascial fibromatosis: Secondary | ICD-10-CM | POA: Diagnosis not present

## 2017-04-10 DIAGNOSIS — B353 Tinea pedis: Secondary | ICD-10-CM | POA: Diagnosis not present

## 2017-04-10 DIAGNOSIS — L821 Other seborrheic keratosis: Secondary | ICD-10-CM | POA: Diagnosis not present

## 2017-04-10 DIAGNOSIS — L918 Other hypertrophic disorders of the skin: Secondary | ICD-10-CM | POA: Diagnosis not present

## 2017-04-10 DIAGNOSIS — D225 Melanocytic nevi of trunk: Secondary | ICD-10-CM | POA: Diagnosis not present

## 2017-04-13 DIAGNOSIS — R030 Elevated blood-pressure reading, without diagnosis of hypertension: Secondary | ICD-10-CM | POA: Diagnosis not present

## 2017-04-13 DIAGNOSIS — Z79899 Other long term (current) drug therapy: Secondary | ICD-10-CM | POA: Diagnosis not present

## 2017-04-13 DIAGNOSIS — Z86711 Personal history of pulmonary embolism: Secondary | ICD-10-CM | POA: Diagnosis not present

## 2017-04-16 DIAGNOSIS — M79671 Pain in right foot: Secondary | ICD-10-CM | POA: Diagnosis not present

## 2017-04-16 DIAGNOSIS — M7731 Calcaneal spur, right foot: Secondary | ICD-10-CM | POA: Diagnosis not present

## 2017-04-16 DIAGNOSIS — M722 Plantar fascial fibromatosis: Secondary | ICD-10-CM | POA: Diagnosis not present

## 2017-04-20 DIAGNOSIS — M722 Plantar fascial fibromatosis: Secondary | ICD-10-CM | POA: Diagnosis not present

## 2017-04-20 DIAGNOSIS — M79671 Pain in right foot: Secondary | ICD-10-CM | POA: Diagnosis not present

## 2017-04-20 DIAGNOSIS — M7731 Calcaneal spur, right foot: Secondary | ICD-10-CM | POA: Diagnosis not present

## 2017-05-04 DIAGNOSIS — M79671 Pain in right foot: Secondary | ICD-10-CM | POA: Diagnosis not present

## 2017-05-04 DIAGNOSIS — M722 Plantar fascial fibromatosis: Secondary | ICD-10-CM | POA: Diagnosis not present

## 2017-05-17 DIAGNOSIS — M79671 Pain in right foot: Secondary | ICD-10-CM | POA: Diagnosis not present

## 2017-05-17 DIAGNOSIS — M791 Myalgia: Secondary | ICD-10-CM | POA: Diagnosis not present

## 2017-05-17 DIAGNOSIS — M722 Plantar fascial fibromatosis: Secondary | ICD-10-CM | POA: Diagnosis not present

## 2017-05-17 DIAGNOSIS — M79672 Pain in left foot: Secondary | ICD-10-CM | POA: Diagnosis not present

## 2017-06-17 DIAGNOSIS — M791 Myalgia: Secondary | ICD-10-CM | POA: Diagnosis not present

## 2017-06-22 DIAGNOSIS — M722 Plantar fascial fibromatosis: Secondary | ICD-10-CM | POA: Diagnosis not present

## 2017-06-22 DIAGNOSIS — M7731 Calcaneal spur, right foot: Secondary | ICD-10-CM | POA: Diagnosis not present

## 2017-06-22 DIAGNOSIS — M79671 Pain in right foot: Secondary | ICD-10-CM | POA: Diagnosis not present

## 2017-07-17 DIAGNOSIS — M791 Myalgia, unspecified site: Secondary | ICD-10-CM | POA: Diagnosis not present

## 2017-08-17 DIAGNOSIS — M791 Myalgia, unspecified site: Secondary | ICD-10-CM | POA: Diagnosis not present

## 2017-10-15 DIAGNOSIS — Z79899 Other long term (current) drug therapy: Secondary | ICD-10-CM | POA: Diagnosis not present

## 2017-10-15 DIAGNOSIS — Z86711 Personal history of pulmonary embolism: Secondary | ICD-10-CM | POA: Diagnosis not present

## 2017-10-15 DIAGNOSIS — R030 Elevated blood-pressure reading, without diagnosis of hypertension: Secondary | ICD-10-CM | POA: Diagnosis not present

## 2018-03-16 DIAGNOSIS — H6122 Impacted cerumen, left ear: Secondary | ICD-10-CM | POA: Diagnosis not present

## 2018-03-16 DIAGNOSIS — R030 Elevated blood-pressure reading, without diagnosis of hypertension: Secondary | ICD-10-CM | POA: Diagnosis not present

## 2018-03-18 DIAGNOSIS — Z79899 Other long term (current) drug therapy: Secondary | ICD-10-CM | POA: Diagnosis not present

## 2018-03-18 DIAGNOSIS — R030 Elevated blood-pressure reading, without diagnosis of hypertension: Secondary | ICD-10-CM | POA: Diagnosis not present

## 2018-06-04 DIAGNOSIS — B353 Tinea pedis: Secondary | ICD-10-CM | POA: Diagnosis not present

## 2018-09-26 DIAGNOSIS — Z79899 Other long term (current) drug therapy: Secondary | ICD-10-CM | POA: Diagnosis not present

## 2018-09-26 DIAGNOSIS — I10 Essential (primary) hypertension: Secondary | ICD-10-CM | POA: Diagnosis not present

## 2018-09-26 DIAGNOSIS — Z86711 Personal history of pulmonary embolism: Secondary | ICD-10-CM | POA: Diagnosis not present

## 2019-04-05 ENCOUNTER — Other Ambulatory Visit: Payer: Self-pay

## 2019-04-05 ENCOUNTER — Emergency Department (HOSPITAL_BASED_OUTPATIENT_CLINIC_OR_DEPARTMENT_OTHER)
Admission: EM | Admit: 2019-04-05 | Discharge: 2019-04-06 | Disposition: A | Discharge status: Home / Self Care | Payer: 59 | Attending: Emergency Medicine | Admitting: Emergency Medicine

## 2019-04-05 ENCOUNTER — Encounter (HOSPITAL_BASED_OUTPATIENT_CLINIC_OR_DEPARTMENT_OTHER): Payer: Self-pay

## 2019-04-05 DIAGNOSIS — Z7901 Long term (current) use of anticoagulants: Secondary | ICD-10-CM | POA: Insufficient documentation

## 2019-04-05 DIAGNOSIS — Z20828 Contact with and (suspected) exposure to other viral communicable diseases: Secondary | ICD-10-CM | POA: Insufficient documentation

## 2019-04-05 DIAGNOSIS — Z79899 Other long term (current) drug therapy: Secondary | ICD-10-CM | POA: Diagnosis not present

## 2019-04-05 DIAGNOSIS — E876 Hypokalemia: Secondary | ICD-10-CM

## 2019-04-05 DIAGNOSIS — I10 Essential (primary) hypertension: Secondary | ICD-10-CM | POA: Diagnosis not present

## 2019-04-05 DIAGNOSIS — R509 Fever, unspecified: Secondary | ICD-10-CM | POA: Diagnosis present

## 2019-04-05 DIAGNOSIS — K529 Noninfective gastroenteritis and colitis, unspecified: Secondary | ICD-10-CM | POA: Diagnosis not present

## 2019-04-05 DIAGNOSIS — R112 Nausea with vomiting, unspecified: Secondary | ICD-10-CM

## 2019-04-05 HISTORY — DX: Essential (primary) hypertension: I10

## 2019-04-05 LAB — COMPREHENSIVE METABOLIC PANEL
ALT: 37 U/L (ref 0–44)
AST: 29 U/L (ref 15–41)
Albumin: 4.1 g/dL (ref 3.5–5.0)
Alkaline Phosphatase: 76 U/L (ref 38–126)
Anion gap: 18 — ABNORMAL HIGH (ref 5–15)
BUN: 17 mg/dL (ref 6–20)
CO2: 17 mmol/L — ABNORMAL LOW (ref 22–32)
Calcium: 9.2 mg/dL (ref 8.9–10.3)
Chloride: 99 mmol/L (ref 98–111)
Creatinine, Ser: 1.23 mg/dL (ref 0.61–1.24)
GFR calc Af Amer: >60 mL/min (ref >60)
GFR calc non Af Amer: >60 mL/min (ref >60)
Glucose, Bld: 126 mg/dL — ABNORMAL HIGH (ref 70–99)
Potassium: 2.9 mmol/L — ABNORMAL LOW (ref 3.5–5.1)
Sodium: 134 mmol/L — ABNORMAL LOW (ref 135–145)
Total Bilirubin: 0.7 mg/dL (ref 0.3–1.2)
Total Protein: 8.6 g/dL — ABNORMAL HIGH (ref 6.5–8.1)

## 2019-04-05 LAB — CBC WITH DIFFERENTIAL/PLATELET
Abs Immature Granulocytes: 0.03 10*3/uL (ref 0.00–0.07)
Basophils Absolute: 0 10*3/uL (ref 0.0–0.1)
Basophils Relative: 1 %
Eosinophils Absolute: 0 10*3/uL (ref 0.0–0.5)
Eosinophils Relative: 0 %
HCT: 52.4 % — ABNORMAL HIGH (ref 39.0–52.0)
Hemoglobin: 17.8 g/dL — ABNORMAL HIGH (ref 13.0–17.0)
Immature Granulocytes: 0 %
Lymphocytes Relative: 14 %
Lymphs Abs: 1.2 10*3/uL (ref 0.7–4.0)
MCH: 30.4 pg (ref 26.0–34.0)
MCHC: 34 g/dL (ref 30.0–36.0)
MCV: 89.6 fL (ref 80.0–100.0)
Monocytes Absolute: 1.4 10*3/uL — ABNORMAL HIGH (ref 0.1–1.0)
Monocytes Relative: 17 %
Neutro Abs: 5.9 10*3/uL (ref 1.7–7.7)
Neutrophils Relative %: 68 %
Platelets: 232 10*3/uL (ref 150–400)
RBC: 5.85 MIL/uL — ABNORMAL HIGH (ref 4.22–5.81)
RDW: 12.6 % (ref 11.5–15.5)
WBC: 8.6 10*3/uL (ref 4.0–10.5)
nRBC: 0 % (ref 0.0–0.2)

## 2019-04-05 MED ORDER — POTASSIUM CHLORIDE CRYS ER 20 MEQ PO TBCR
40.0000 meq | EXTENDED_RELEASE_TABLET | Freq: Once | ORAL | Status: AC
  Administered 2019-04-05: 40 meq via ORAL
  Filled 2019-04-05: qty 2

## 2019-04-05 MED ORDER — POTASSIUM CHLORIDE 10 MEQ/100ML IV SOLN
10.0000 meq | Freq: Once | INTRAVENOUS | Status: AC
  Administered 2019-04-05: 10 meq via INTRAVENOUS
  Filled 2019-04-05: qty 100

## 2019-04-05 MED ORDER — MAGNESIUM SULFATE 2 GM/50ML IV SOLN
2.0000 g | Freq: Once | INTRAVENOUS | Status: AC
  Administered 2019-04-05: 2 g via INTRAVENOUS
  Filled 2019-04-05: qty 50

## 2019-04-05 MED ORDER — POTASSIUM CHLORIDE CRYS ER 20 MEQ PO TBCR: 40.0000 meq | EXTENDED_RELEASE_TABLET | Freq: Every day | ORAL | 0 refills | Status: AC

## 2019-04-05 MED ORDER — LACTATED RINGERS IV BOLUS
1000.0000 mL | Freq: Once | INTRAVENOUS | Status: AC
  Administered 2019-04-05: 1000 mL via INTRAVENOUS

## 2019-04-05 MED ORDER — ONDANSETRON 4 MG PO TBDP: 4.0000 mg | ORAL_TABLET | Freq: Three times a day (TID) | ORAL | 0 refills | Status: AC | PRN

## 2019-04-05 NOTE — ED Provider Notes (Signed)
[facility]ENC[facility] DEPARTMENT Provider Note   CSN: [id number] Arrival date & time: 04/05/19  1947    History   Chief Complaint Chief Complaint  Patient presents with  . Fever    HPI Glenn Jackson is a 43 y.o. male.     HPI  43yo male with hx of PE, DVT, htn, presents with concern for nausea, vomiting, diarrhea and subjective fevers. Reports symptoms began while he was on a beach vacation. Noone else has been sick. No recent abx. Has not eaten anything suspicious that he knows of.  Denies significant abdominal pain, other than "hunger pains." Initially was having diarrhea very frequently, nearly every hour, has improved somewhat but continuing.  Feels like everything he eats goes straight through him. Watery diarrhea, no black or bloody stool.  Subjective fever and has been taking tylenol.   Denies cough, dyspnea, loss of smell or taste, other symptoms. Past Medical History:  Diagnosis Date  . DVT (deep venous thrombosis) ([place])   . Hypertension     Patient Active Problem List   Diagnosis Date Noted  . PE (pulmonary thromboembolism) ([place]) 09/22/2013  . Pulmonary embolism ([place]) 09/22/2013  . 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 Unspecified deficiency anemia 05/14/2012    Past Surgical History:  Procedure Laterality Date  . ANKLE SURGERY          Home Medications    Prior to Admission medications   Medication Sig Start Date End Date Taking? Authorizing Provider  Multiple Vitamin (MULTIVITAMIN WITH MINERALS) TABS tablet Take 1 tablet by mouth every morning.    [provider]  ondansetron (ZOFRAN ODT) 4 MG disintegrating tablet Take 1 tablet (4 mg total) by mouth every 8 (eight) hours as needed for nausea or vomiting. 04/05/19   Gareth Morgan, MD  potassium chloride SA (K-DUR) 20 MEQ tablet Take 2 tablets (40 mEq total) by mouth daily for 5 days. 04/05/19 04/10/19  Gareth Morgan, MD  Rivaroxaban (XARELTO STARTER PACK) 15 & 20  MG TBPK Take as directed on package: Start with one 15mg  tablet by mouth twice a day with food. On Day 22, switch to one 20mg  tablet once a day with food. 09/22/13   Velvet Bathe, MD    Family History No family history on file.  Social History Social History   Tobacco Use  . Smoking status: Never Smoker  . Smokeless tobacco: Never Used  Substance Use Topics  . Alcohol use: Yes    Comment: occasional  . Drug use: No     Allergies   Ibuprofen and Pseudoephedrine   Review of Systems Review of Systems  Constitutional: Positive for chills, fatigue and fever.  HENT: Negative for sore throat.   Eyes: Negative for visual disturbance.  Respiratory: Negative for cough and shortness of breath.   Cardiovascular: Negative for chest pain.  Gastrointestinal: Positive for diarrhea, nausea and vomiting. Negative for abdominal pain.  Genitourinary: Positive for decreased urine volume. Negative for difficulty urinating.  Musculoskeletal: Negative for neck stiffness.  Skin: Negative for rash.  Neurological: Negative for syncope and headaches.     Physical Exam Updated Vital Signs BP 124/65 (BP Location: Left Arm)   Pulse 65   Temp 98 F (36.7 C)   Resp 18   Ht 5\' 7"  (1.702 m)   Wt 90.7 kg   SpO2 100%   BMI 31.32 kg/m   Physical Exam Vitals signs and nursing note reviewed.  Constitutional:      General: He  is not in acute distress.    Appearance: He is well-developed. He is not diaphoretic.  HENT:     Head: Normocephalic and atraumatic.  Eyes:     Conjunctiva/sclera: Conjunctivae normal.  Neck:     Musculoskeletal: Normal range of motion.  Cardiovascular:     Rate and Rhythm: Normal rate and regular rhythm.  Pulmonary:     Effort: Pulmonary effort is normal. No respiratory distress.  Abdominal:     General: There is no distension.     Palpations: Abdomen is soft.     Tenderness: There is no abdominal tenderness. There is no guarding.  Skin:    General: Skin is warm  and dry.  Neurological:     Mental Status: He is alert and oriented to person, place, and time.      ED Treatments / Results  Labs (all labs ordered are listed, but only abnormal results are displayed) Labs Reviewed  CBC WITH DIFFERENTIAL/PLATELET - Abnormal; Notable for the following components:      Result Value   RBC 5.85 (*)    Hemoglobin 17.8 (*)    HCT 52.4 (*)    Monocytes Absolute 1.4 (*)    All other components within normal limits  COMPREHENSIVE METABOLIC PANEL - Abnormal; Notable for the following components:   Sodium 134 (*)    Potassium 2.9 (*)    CO2 17 (*)    Glucose, Bld 126 (*)    Total Protein 8.6 (*)    Anion gap 18 (*)    All other components within normal limits  NOVEL CORONAVIRUS, NAA (HOSPITAL ORDER, SEND-OUT TO REF LAB)    EKG None  Radiology No results found.  Procedures Procedures (including critical care time)  Medications Ordered in ED Medications  lactated ringers bolus 1,000 mL (0 mLs Intravenous Stopped 04/06/19 0002)  potassium chloride 10 mEq in 100 mL IVPB (0 mEq Intravenous Stopped 04/06/19 0002)  magnesium sulfate IVPB 2 g 50 mL (0 g Intravenous Stopped 04/06/19 0002)  potassium chloride SA (K-DUR) CR tablet 40 mEq (40 mEq Oral Given 04/05/19 2241)     Initial Impression / Assessment and Plan / ED Course  I have reviewed the triage vital signs and the nursing notes.  Pertinent labs & imaging results that were available during my care of the patient were reviewed by me and considered in my medical decision making (see chart for details).        43yo male with hx of PE, DVT, htn, presents with concern for nausea, vomiting, diarrhea and subjective fevers.  Labs show hypokalemia K of 2.9. Given IV K and Mg, po K.  Abdominal exam benign, no sign of acute intraabdominal pathology. Suspect likely viral or bacterial gastroenteritis and recommend continued hydration and supportive care. Given IV fluids.  Given rx for zofran, K. COVID19  test ordered and recommend quarantine until results return.  Patient discharged in stable condition with understanding of reasons to return.   Glenn Jackson was evaluated in Emergency Department on 04/06/2019 for the symptoms described in the history of present illness. He was evaluated in the context of the global COVID-19 pandemic, which necessitated consideration that the patient might be at risk for infection with the SARS-CoV-2 virus that causes COVID-19. Institutional protocols and algorithms that pertain to the evaluation of patients at risk for COVID-19 are in a state of rapid change based on information released by regulatory bodies including the CDC and federal and state organizations. These policies and algorithms  were followed during the patient's care in the ED.   Final Clinical Impressions(s) / ED Diagnoses   Final diagnoses:  Nausea vomiting and diarrhea  Hypokalemia  Gastroenteritis    ED Discharge Orders         Ordered    ondansetron (ZOFRAN ODT) 4 MG disintegrating tablet  Every 8 hours PRN     04/05/19 2339    potassium chloride SA (K-DUR) 20 MEQ tablet  Daily     04/05/19 2339           Gareth Morgan, MD 04/06/19 1520

## 2019-04-05 NOTE — ED Triage Notes (Signed)
Pt was at the beach last weekend. On 7/15 pt developed fever, N/V/D, body aches. Pt last took APAP at 1600.

## 2019-04-09 LAB — NOVEL CORONAVIRUS, NAA (HOSP ORDER, SEND-OUT TO REF LAB; TAT 18-24 HRS): SARS-CoV-2, NAA: NOT DETECTED

## 2021-04-08 ENCOUNTER — Ambulatory Visit
Admission: RE | Admit: 2021-04-08 | Discharge: 2021-04-08 | Disposition: A | Discharge status: Home / Self Care | Payer: 59 | Source: Ambulatory Visit | Attending: Chiropractic Medicine | Admitting: Chiropractic Medicine

## 2021-04-08 ENCOUNTER — Other Ambulatory Visit: Payer: Self-pay | Admitting: Chiropractic Medicine

## 2021-04-08 ENCOUNTER — Other Ambulatory Visit: Payer: Self-pay

## 2021-04-08 DIAGNOSIS — M542 Cervicalgia: Secondary | ICD-10-CM

## 2021-04-08 DIAGNOSIS — M545 Low back pain, unspecified: Secondary | ICD-10-CM

## 2021-04-08 DIAGNOSIS — M25511 Pain in right shoulder: Secondary | ICD-10-CM

## 2023-02-05 IMAGING — CR DG THORACIC SPINE 3V
4 series · 4 of 4 positions shown · non-contrast
Comparison: CT chest abdomen pelvis 05/17/2012, CT angio chest
09/22/2013

CLINICAL DATA: Right neck and shoulder pain for three months
following doing a lot of shoveling in hard ground. cervicalgia
radiating to right scalpula/ lbp

EXAM:
CERVICAL SPINE - COMPLETE 4+ VIEW; LUMBAR SPINE - COMPLETE 4+ VIEW;
THORACIC SPINE - 3 VIEWS

[w thoracic spine ap]
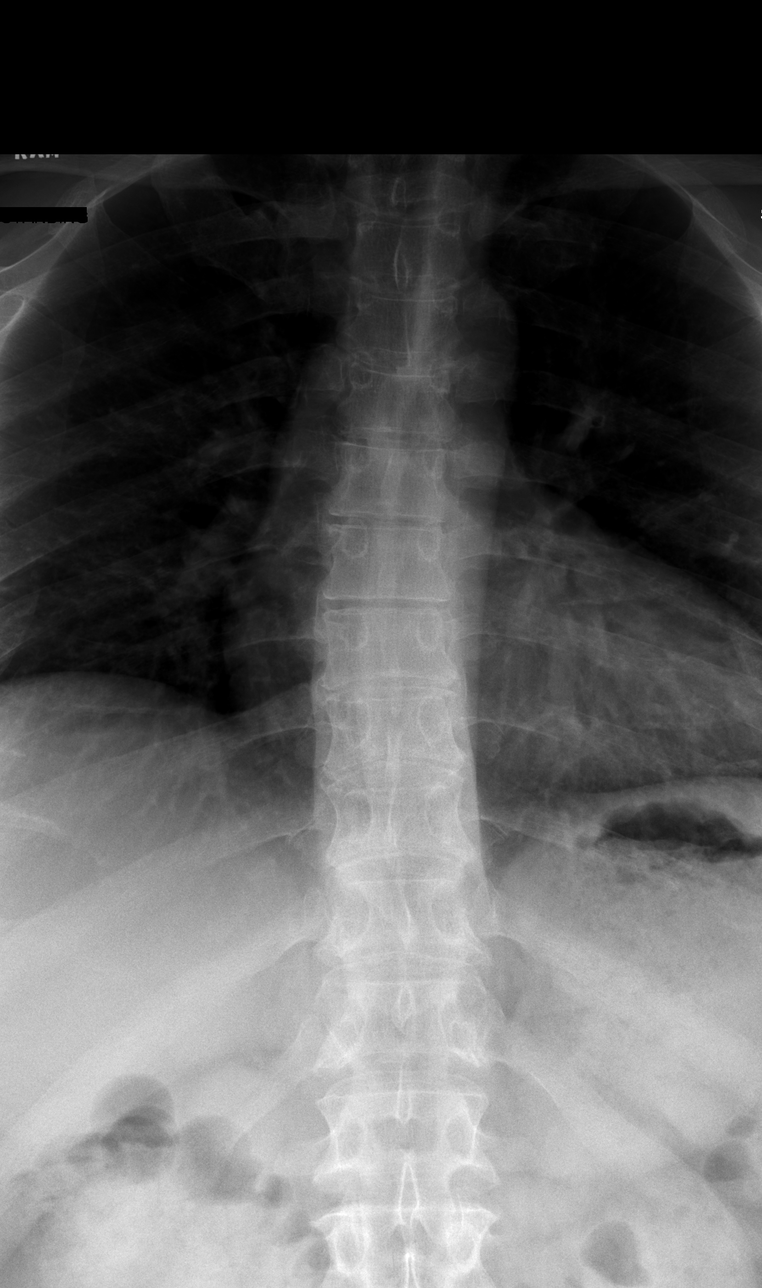

[w thoracic spine lat (1 of 2)]
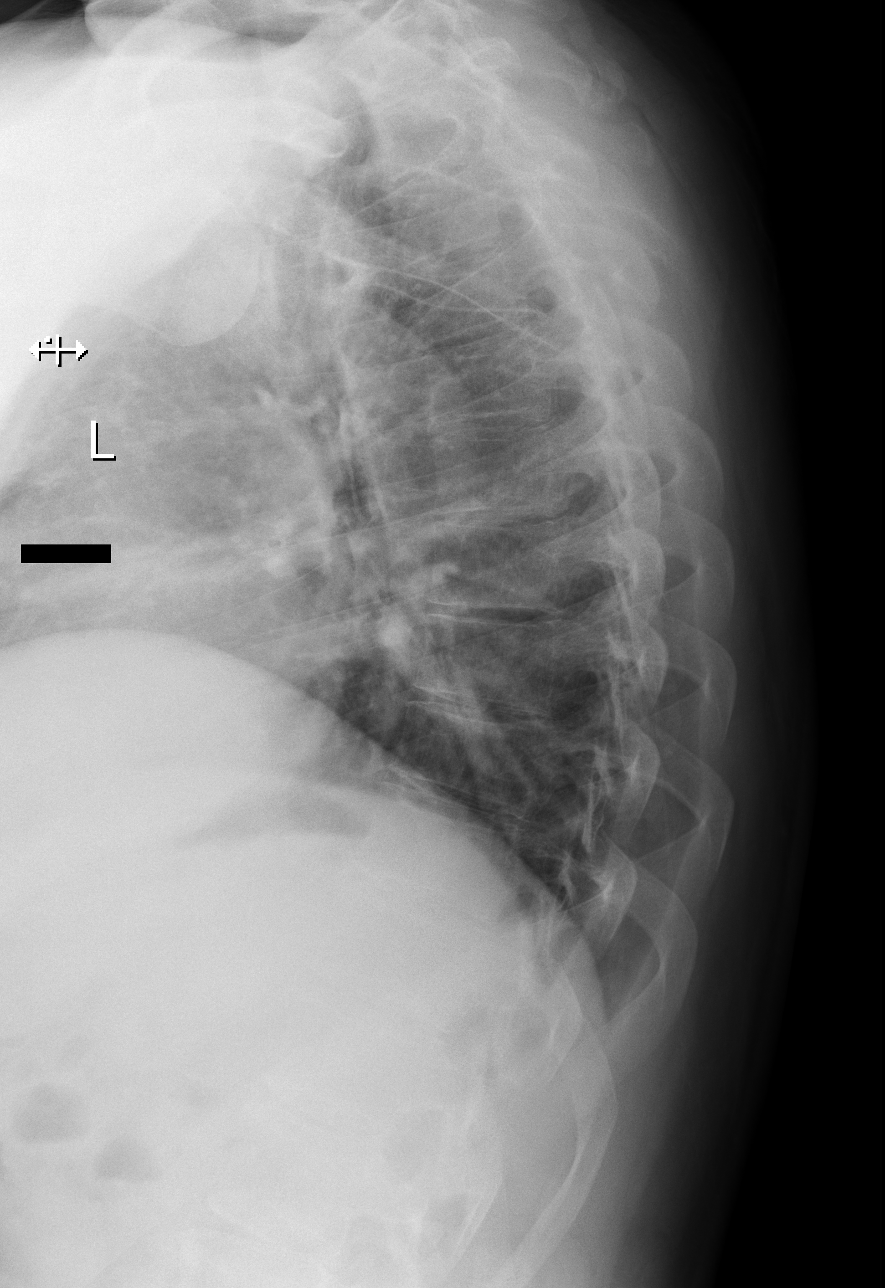

[w thoracic spine lat (2 of 2)]
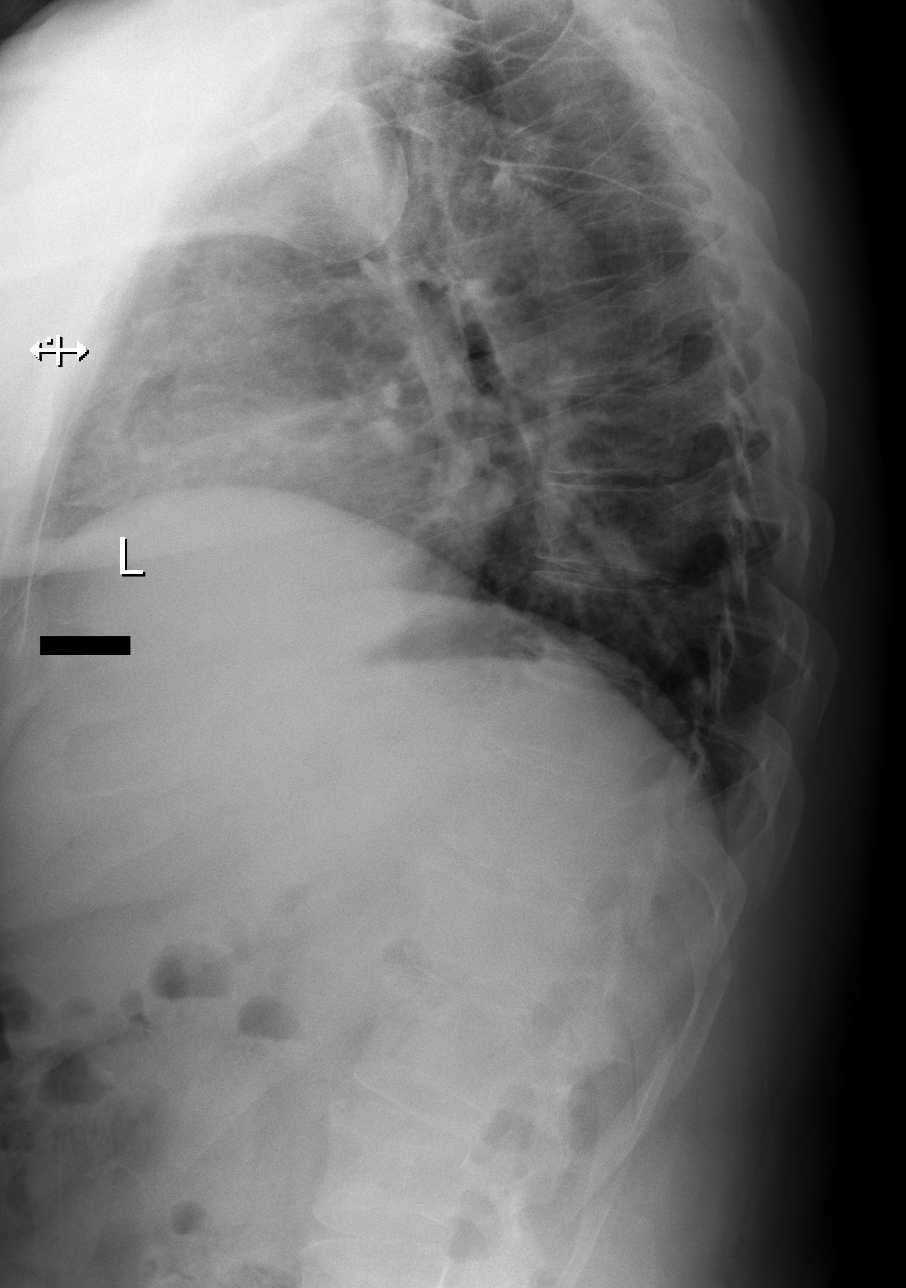

[w thoracic swimmers]
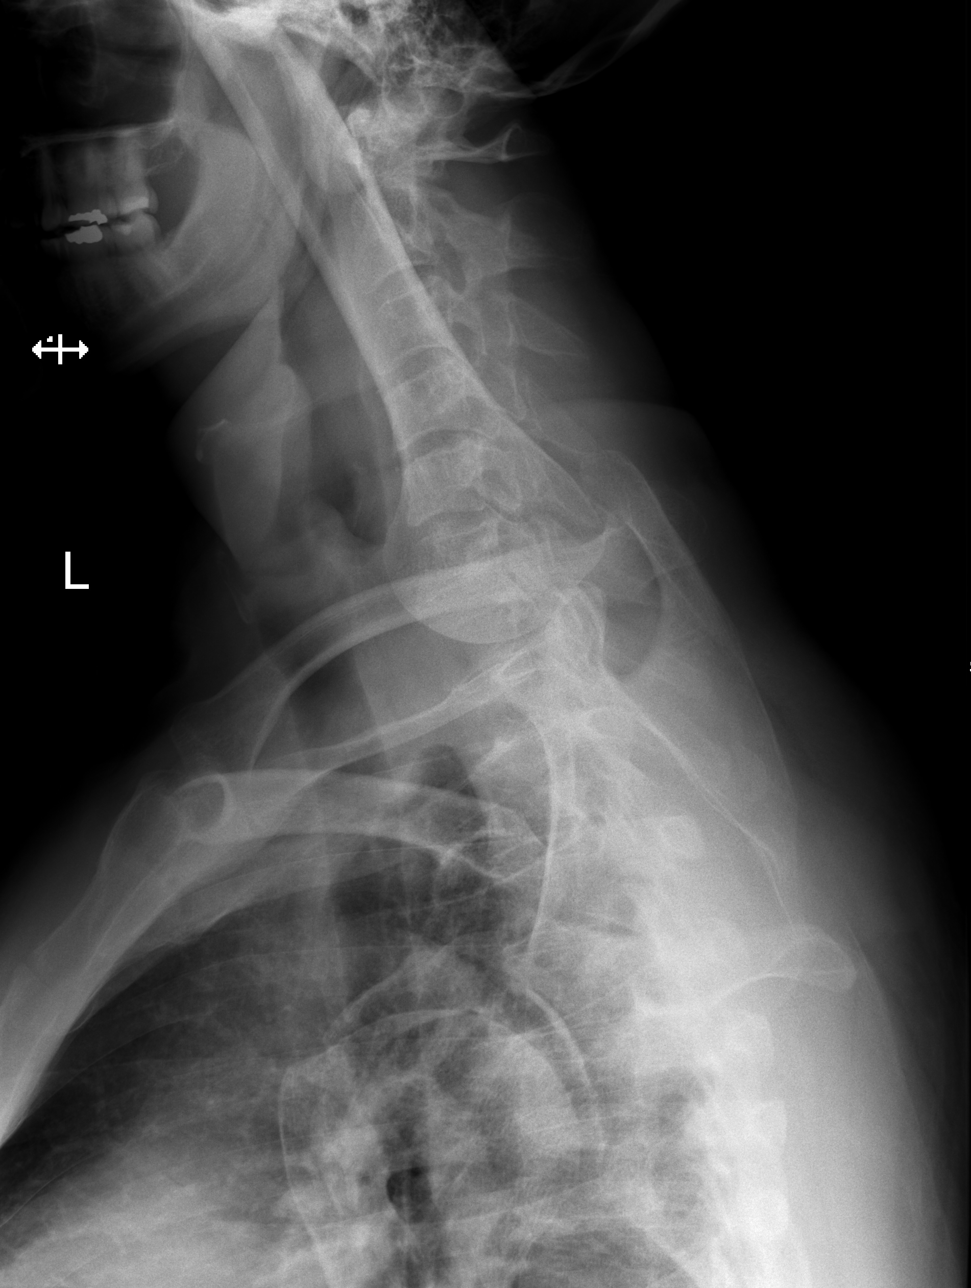

[4 of 4 positions shown; findings below may reference images not displayed]

FINDINGS: Limited evaluation due to overlapping osseous structures and
overlying soft tissues.

Cervical:

On the lateral view the cervical spine is visualized to the level of
C7. Alignment is normal.

Dens is well positioned between the lateral masses of C1. There is
limited evaluation of the dens for acute fracture on the open-mouth
view due to overlying osseous structures.

Question osseous neural foraminal stenosis at the C6-C7 level on
left. No acute displaced fracture is detected. Cervical disc heights
are preserved.No aggressive-appearing focal osseous lesions.

Pre-vertebral soft tissues are within normal limits.

Thoracic: There is no evidence of thoracic spine fracture. Alignment
is normal. Intervertebral disc spaces are maintained.

Lumbar: 5 non-rib-bearing lumbar vertebral bodies. There is no
evidence of lumbar spine fracture. Alignment is normal.
Intervertebral disc spaces are maintained.
IMPRESSION: 1. No acute displaced fracture or traumatic listhesis of the
cervical, thoracic, lumbar spine.
2. Question osseous neural foraminal stenosis at the C6-C7 level on
left. Limited evaluation due to overlapping osseous structures and
overlying soft tissues.
